# Patient Record
Sex: Female | Born: 1995 | Race: White | Hispanic: No | Marital: Single | Smoking: Never smoker
Health system: Southern US, Community
[De-identification: ages and names within clinical notes are randomized; demographics above are authoritative.]

## PROBLEM LIST (undated history)

## (undated) DIAGNOSIS — E282 Polycystic ovarian syndrome: Secondary | ICD-10-CM

---
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
## 2015-06-29 IMAGING — CT CT ABD-PELV W/O CM
1 series · 14 of 18 positions shown, 19 images · non-contrast
Comparison: None.

CLINICAL DATA: Right flank pain, dysuria

EXAM:
CT ABDOMEN AND PELVIS WITHOUT CONTRAST
TECHNIQUE: Multidetector CT imaging of the abdomen and pelvis was performed
following the standard protocol without IV contrast.

[Series 6: lung · axial · 0.66mm/px · z∈[+1352,+1428]mm · 14 of 18 slices shown, 19 images]
[im 2/18  soft-tissue]
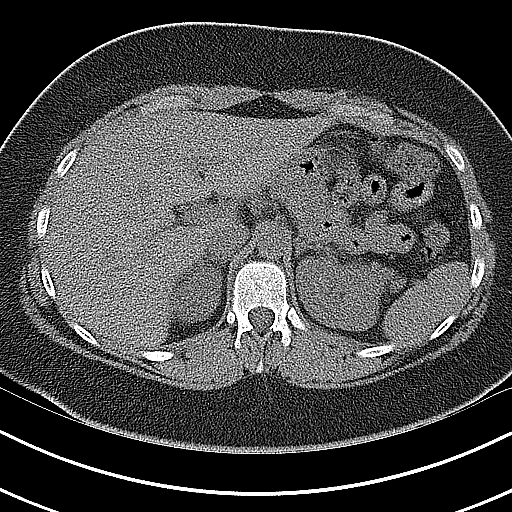
[im 2/18  bone]
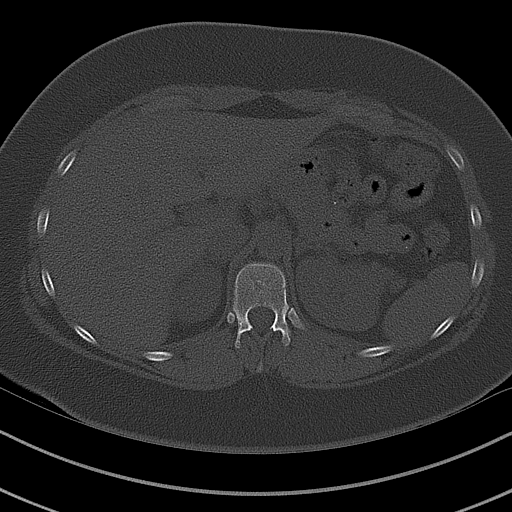
[im 3/18  soft-tissue]
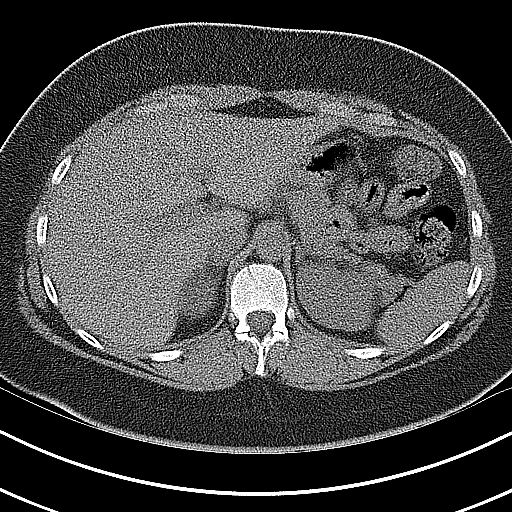
[im 5/18  soft-tissue]
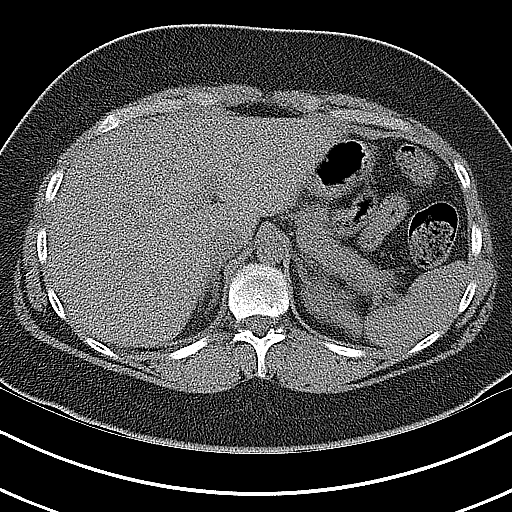
[im 6/18  soft-tissue]
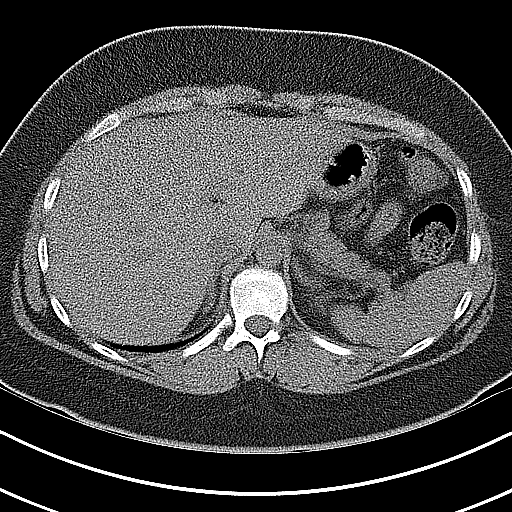
[im 7/18  soft-tissue]
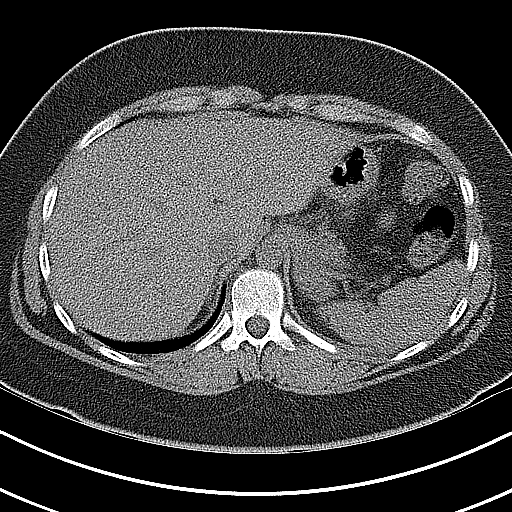
[im 8/18  soft-tissue]
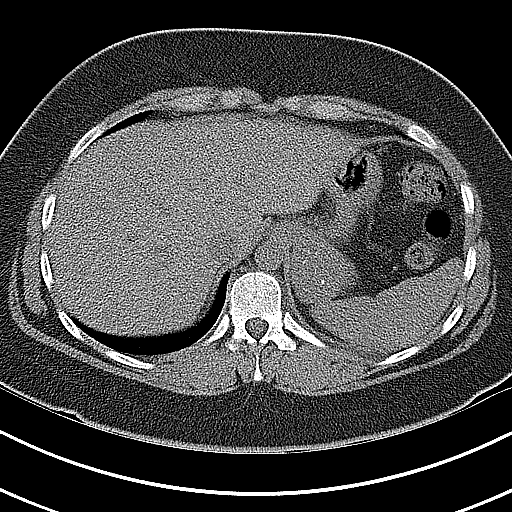
[im 10/18  soft-tissue]
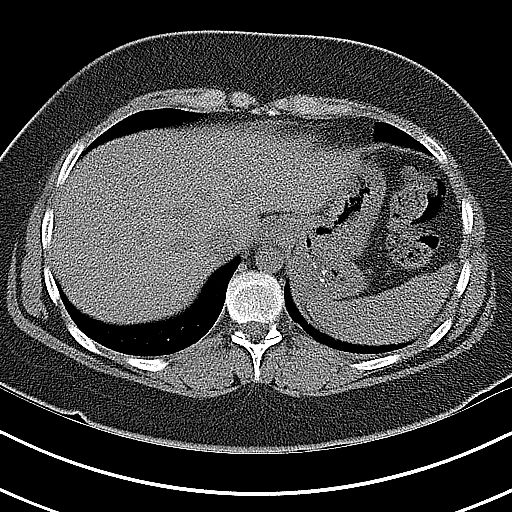
[im 11/18  soft-tissue]
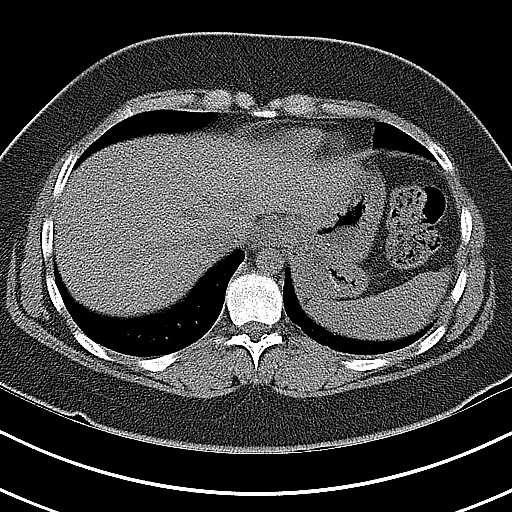
[im 12/18  soft-tissue]
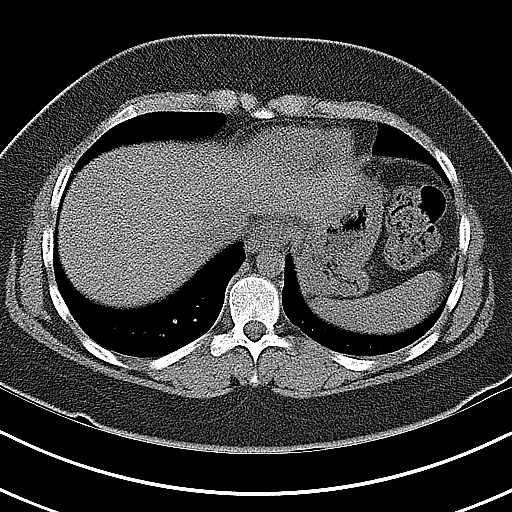
[im 12/18  bone]
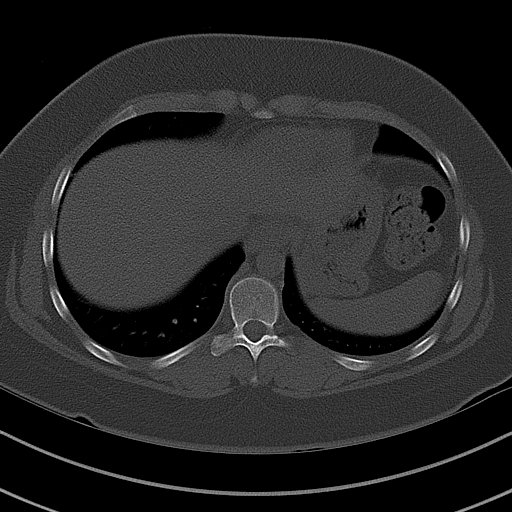
[im 13/18  soft-tissue]
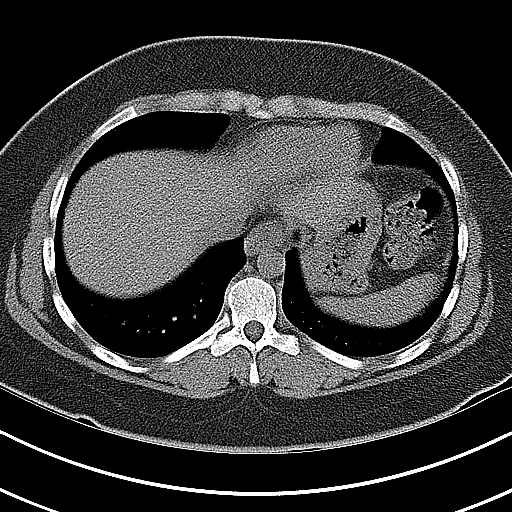
[im 14/18  soft-tissue]
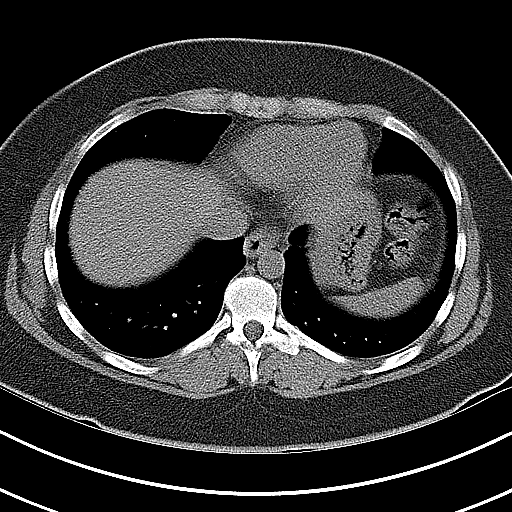
[im 14/18  lung]
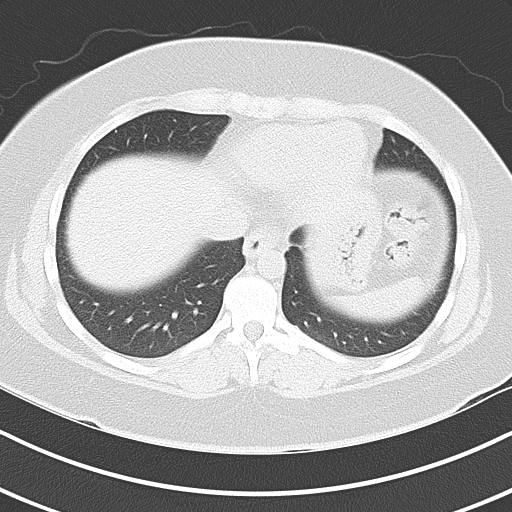
[im 15/18  lung]
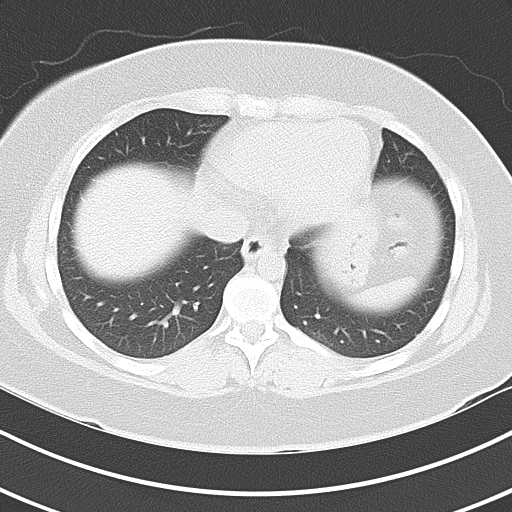
[im 16/18  soft-tissue]
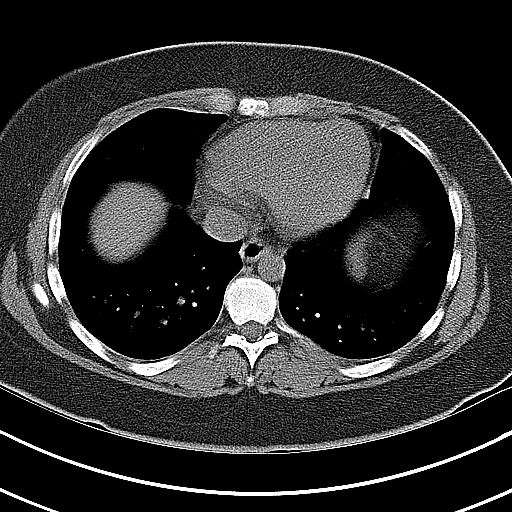
[im 16/18  lung]
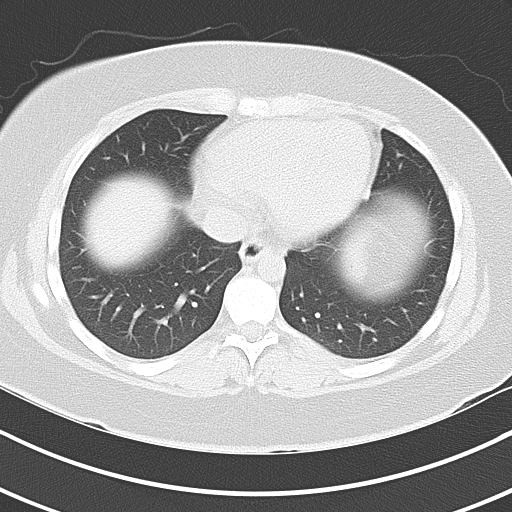
[im 17/18  soft-tissue]
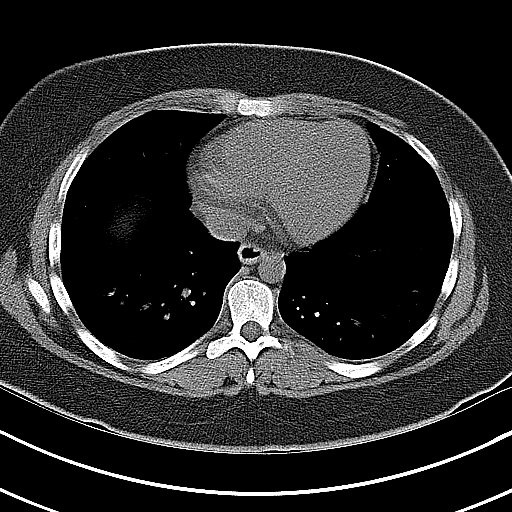
[im 17/18  lung]
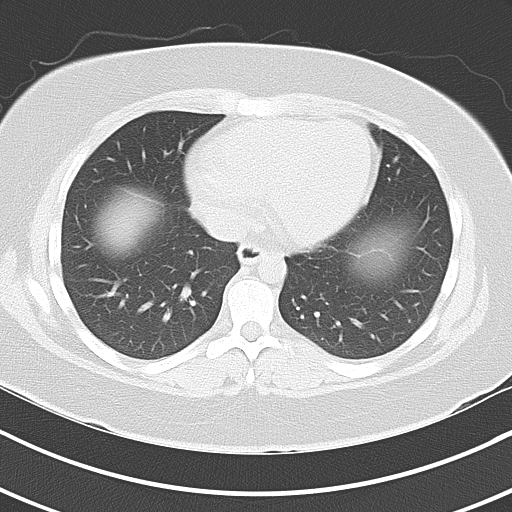

[14 of 18 positions shown; findings below may reference images not displayed]

FINDINGS: Clear lung bases. Normal heart size. No pericardial or pleural
effusion.

And: Right kidney demonstrates mild perinephric stranding edema,
hydronephrosis, and associated right hydroureter. This extends into
the pelvis where there is a tiny punctate 3 mm mildly obstructing
right UVJ calculus, image 73. This is confirmed on the coronal and
sagittal reconstructions.

Right kidney also demonstrates an additional punctate tiny midpole
nonobstructing calculus on the coronal reconstructions, image 44.
Left kidney and ureter demonstrate no acute process or stone
disease.

Liver, gallbladder, biliary system, pancreas, spleen, and adrenal
glands are within normal limits for age and noncontrast imaging.

Negative for bowel obstruction, dilatation, ileus, or free air.
Hyperdense ingested material noted within loops of small bowel.

No abdominal free fluid, fluid collection, hemorrhage, abscess, or
adenopathy.

Normal retrocecal appendix noted.

Pelvis: Uterus and adnexal normal in size. Urinary bladder glands.
No significant pelvic free fluid, fluid collection, hemorrhage,
abscess, adenopathy, or inguinal abnormality.

No acute osseous finding.
IMPRESSION: 3 mm punctate mildly obstructing right UVJ calculus with associated
mild right hydroureteronephrosis.

Additional punctate tiny nonobstructing right intrarenal calculus.
